# Patient Record
Sex: Female | Born: 2000 | Race: Black or African American | Hispanic: No | Smoking: Current some day smoker
Health system: Southern US, Community
[De-identification: ages and names within clinical notes are randomized; demographics above are authoritative.]

## PROBLEM LIST (undated history)

## (undated) DIAGNOSIS — F329 Major depressive disorder, single episode, unspecified: Secondary | ICD-10-CM

## (undated) DIAGNOSIS — D649 Anemia, unspecified: Secondary | ICD-10-CM

## (undated) DIAGNOSIS — F32A Depression, unspecified: Secondary | ICD-10-CM

## (undated) DIAGNOSIS — I10 Essential (primary) hypertension: Secondary | ICD-10-CM

## (undated) DIAGNOSIS — Z87440 Personal history of urinary (tract) infections: Secondary | ICD-10-CM

## (undated) DIAGNOSIS — F99 Mental disorder, not otherwise specified: Secondary | ICD-10-CM

## (undated) DIAGNOSIS — G43909 Migraine, unspecified, not intractable, without status migrainosus: Secondary | ICD-10-CM

## (undated) HISTORY — DX: Depression, unspecified: F32.A

## (undated) HISTORY — PX: TONSILLECTOMY: SUR1361

## (undated) HISTORY — DX: Migraine, unspecified, not intractable, without status migrainosus: G43.909

## (undated) HISTORY — DX: Personal history of urinary (tract) infections: Z87.440

## (undated) HISTORY — DX: Essential (primary) hypertension: I10

## (undated) HISTORY — DX: Mental disorder, not otherwise specified: F99

## (undated) HISTORY — DX: Anemia, unspecified: D64.9

---
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
## 2023-11-19 ENCOUNTER — Emergency Department (HOSPITAL_COMMUNITY): Admission: EM | Admit: 2023-11-19 | Discharge: 2023-11-20 | Discharge status: Against Medical Advice | Payer: MEDICAID

## 2023-11-19 ENCOUNTER — Other Ambulatory Visit: Payer: Self-pay

## 2023-11-19 DIAGNOSIS — T391X1A Poisoning by 4-Aminophenol derivatives, accidental (unintentional), initial encounter: Secondary | ICD-10-CM | POA: Diagnosis present

## 2023-11-19 DIAGNOSIS — R109 Unspecified abdominal pain: Secondary | ICD-10-CM | POA: Insufficient documentation

## 2023-11-19 DIAGNOSIS — K0889 Other specified disorders of teeth and supporting structures: Secondary | ICD-10-CM | POA: Diagnosis not present

## 2023-11-19 DIAGNOSIS — Z5321 Procedure and treatment not carried out due to patient leaving prior to being seen by health care provider: Secondary | ICD-10-CM | POA: Insufficient documentation

## 2023-11-19 LAB — URINALYSIS, ROUTINE W REFLEX MICROSCOPIC
Bilirubin Urine: NEGATIVE
Glucose, UA: NEGATIVE mg/dL
Hgb urine dipstick: NEGATIVE
Ketones, ur: NEGATIVE mg/dL
Leukocytes,Ua: NEGATIVE
Nitrite: POSITIVE — AB
Protein, ur: NEGATIVE mg/dL
Specific Gravity, Urine: 1.029 (ref 1.005–1.030)
pH: 5 (ref 5.0–8.0)

## 2023-11-19 LAB — CBC WITH DIFFERENTIAL/PLATELET
Abs Immature Granulocytes: 0.05 K/uL (ref 0.00–0.07)
Basophils Absolute: 0.1 K/uL (ref 0.0–0.1)
Basophils Relative: 1 %
Eosinophils Absolute: 0.2 K/uL (ref 0.0–0.5)
Eosinophils Relative: 2 %
HCT: 43.4 % (ref 36.0–46.0)
Hemoglobin: 14.4 g/dL (ref 12.0–15.0)
Immature Granulocytes: 1 %
Lymphocytes Relative: 13 %
Lymphs Abs: 1.4 K/uL (ref 0.7–4.0)
MCH: 30.6 pg (ref 26.0–34.0)
MCHC: 33.2 g/dL (ref 30.0–36.0)
MCV: 92.3 fL (ref 80.0–100.0)
Monocytes Absolute: 0.9 K/uL (ref 0.1–1.0)
Monocytes Relative: 8 %
Neutro Abs: 8.1 K/uL — ABNORMAL HIGH (ref 1.7–7.7)
Neutrophils Relative %: 75 %
Platelets: 233 K/uL (ref 150–400)
RBC: 4.7 MIL/uL (ref 3.87–5.11)
RDW: 12.2 % (ref 11.5–15.5)
WBC: 10.7 K/uL — ABNORMAL HIGH (ref 4.0–10.5)
nRBC: 0 % (ref 0.0–0.2)

## 2023-11-19 LAB — COMPREHENSIVE METABOLIC PANEL WITH GFR
ALT: 20 U/L (ref 0–44)
AST: 20 U/L (ref 15–41)
Albumin: 4 g/dL (ref 3.5–5.0)
Alkaline Phosphatase: 80 U/L (ref 38–126)
Anion gap: 10 (ref 5–15)
BUN: 11 mg/dL (ref 6–20)
CO2: 21 mmol/L — ABNORMAL LOW (ref 22–32)
Calcium: 9.2 mg/dL (ref 8.9–10.3)
Chloride: 106 mmol/L (ref 98–111)
Creatinine, Ser: 0.84 mg/dL (ref 0.44–1.00)
GFR, Estimated: >60 mL/min (ref >60)
Glucose, Bld: 130 mg/dL — ABNORMAL HIGH (ref 70–99)
Potassium: 4 mmol/L (ref 3.5–5.1)
Sodium: 137 mmol/L (ref 135–145)
Total Bilirubin: 0.8 mg/dL (ref 0.0–1.2)
Total Protein: 6.8 g/dL (ref 6.5–8.1)

## 2023-11-19 LAB — RAPID URINE DRUG SCREEN, HOSP PERFORMED
Amphetamines: NOT DETECTED
Barbiturates: NOT DETECTED
Benzodiazepines: NOT DETECTED
Cocaine: NOT DETECTED
Opiates: NOT DETECTED
Tetrahydrocannabinol: NOT DETECTED

## 2023-11-19 LAB — LIPASE, BLOOD: Lipase: 36 U/L (ref 11–51)

## 2023-11-19 LAB — ACETAMINOPHEN LEVEL: Acetaminophen (Tylenol), Serum: <10 ug/mL — ABNORMAL LOW (ref 10–30)

## 2023-11-19 LAB — PREGNANCY, URINE: Preg Test, Ur: NEGATIVE

## 2023-11-19 NOTE — ED Triage Notes (Signed)
 First Nurse Note: Pt is concerned because she thinks she took too much acetaminophen . On 10/22 she took 8 500mg  tablets within a span of 4-6 hours. The next day she took 2 more 500 mg tablets in the morning and then took Excedrin in the evening. Pt is trying to control dental pain.

## 2023-11-19 NOTE — ED Provider Triage Note (Signed)
 Emergency Medicine Provider Triage Evaluation Note  Annette Hamilton , a 23 y.o. female  was evaluated in triage.  Pt complains of Tylenol  overdose.  Patient states that she has been experiencing dental pain and that on 11/17/2023 she believes that she took 4000 mg of Tylenol  in approximately 4 to 6 hours.  She states that the next day she took 2 more 500 mg tablets in the morning and took Excedrin in the evening.  Patient states that she did have some mild abdominal pain earlier today but denies nausea or vomiting.  She currently denies abdominal pain, chest pain, or shortness of breath.  Review of Systems  Positive: Abdominal pain, excessive Tylenol  intake Negative: Chest pain, shortness of breath, nausea, vomiting, etc.  Physical Exam  BP 132/72 (BP Location: Right Arm)   Pulse (!) 111   Temp 98.4 F (36.9 C)   Resp 16   SpO2 100%  Gen:   Awake, no distress   Resp:  Normal effort  MSK:   Moves extremities without difficulty  Other:  Abdomen soft and nontender to palpation  Medical Decision Making  Medically screening exam initiated at 8:21 PM.  Appropriate orders placed.  Annette Hamilton was informed that the remainder of the evaluation will be completed by another provider, this initial triage assessment does not replace that evaluation, and the importance of remaining in the ED until their evaluation is complete.  Orders: CBC, CMP, lipase, UA, urine pregnancy, rapid UDS, Tylenol  level, etc.   Annette Terrall FALCON, PA-C 11/19/23 2022

## 2023-11-19 NOTE — ED Triage Notes (Signed)
 Pt complains of dental pain and states that she has had a lot of tylenol  over the past 3 days and looked up symptoms of too much tylenol  and got concerned. Pt states she had a little nausea but no nausea at this time. No SI/HI

## 2023-11-20 NOTE — ED Notes (Signed)
 Pt informed registration that she is without being seen.
